# Patient Record
Sex: Male | Born: 1960 | Race: Black or African American | Hispanic: No | Marital: Married | Smoking: Never smoker
Health system: Southern US, Community
[De-identification: ages and names within clinical notes are randomized; demographics above are authoritative.]

---

## 2007-05-14 ENCOUNTER — Emergency Department (HOSPITAL_COMMUNITY): Admission: EM | Admit: 2007-05-14 | Discharge: 2007-05-14 | Payer: Self-pay | Admitting: Family Medicine

## 2008-10-29 ENCOUNTER — Emergency Department (HOSPITAL_COMMUNITY): Admission: EM | Admit: 2008-10-29 | Discharge: 2008-10-29 | Payer: Self-pay | Admitting: Family Medicine

## 2011-05-02 LAB — POCT URINALYSIS DIP (DEVICE)
Bilirubin Urine: NEGATIVE
Glucose, UA: NEGATIVE
Nitrite: NEGATIVE
Protein, ur: NEGATIVE
Specific Gravity, Urine: 1.02
Urobilinogen, UA: 0.2

## 2012-12-04 ENCOUNTER — Encounter (HOSPITAL_COMMUNITY): Payer: Self-pay | Admitting: Emergency Medicine

## 2012-12-04 ENCOUNTER — Emergency Department (HOSPITAL_COMMUNITY)
Admission: EM | Admit: 2012-12-04 | Discharge: 2012-12-04 | Disposition: A | Discharge status: Home / Self Care | Payer: BC Managed Care – PPO | Source: Home / Self Care

## 2012-12-04 DIAGNOSIS — M6580 Other synovitis and tenosynovitis, unspecified site: Secondary | ICD-10-CM

## 2012-12-04 DIAGNOSIS — M76899 Other specified enthesopathies of unspecified lower limb, excluding foot: Secondary | ICD-10-CM

## 2012-12-04 MED ORDER — TRIAMCINOLONE ACETONIDE 40 MG/ML IJ SUSP
INTRAMUSCULAR | Status: AC
  Filled 2012-12-04: qty 5

## 2012-12-04 NOTE — ED Notes (Signed)
Left knee pain, medially is painful area.  Onset approx 2 months ago and has worsened since then.  No known injury.  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  Describes pain as a stinging, burning sensation

## 2012-12-04 NOTE — ED Provider Notes (Signed)
History     CSN: [id number]  Arrival date & time 12/04/12  1235   First MD Initiated Contact with Patient 12/04/12 1417      Chief Complaint  Patient presents with  . Knee Pain    (Consider location/radiation/quality/duration/timing/severity/associated sxs/prior treatment) HPI Comments: 52 year old male presents with pain to the medial aspect of the left knee for over 2 months. 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 The pain is located over the medial aspect of the pain with the center over the medial tibial condyle. It is stiff in the morning but feels a little better after walking. At nighttime the pain is constant and throbbing. Worse with movement flexion and extension after resting for period of time. No known trauma.   History reviewed. No pertinent past medical history.  History reviewed. No pertinent past surgical history.  No family history on file.  History  Substance Use Topics  . Smoking status: Never Smoker   . Smokeless tobacco: Not on file  . Alcohol Use: No      Review of Systems  Constitutional: Negative.   Respiratory: Negative.   Gastrointestinal: Negative.   Genitourinary: Negative.   Musculoskeletal:       As per HPI  Skin: Negative.   Neurological: Negative for dizziness, weakness, numbness and headaches.    Allergies  Review of patient's allergies indicates no known allergies.  Home Medications  No current outpatient prescriptions on file.  BP 123/84  Pulse 95  Temp(Src) 98.9 F (37.2 C) (Oral)  Resp 19  SpO2 95%  Physical Exam  Nursing note and vitals reviewed. Constitutional: He is oriented to person, place, and time. He appears well-developed and well-nourished.  HENT:  Head: Normocephalic and atraumatic.  Eyes: EOM are normal. Left eye exhibits no discharge.  Neck: Normal range of motion. Neck supple.  Musculoskeletal:  No deformity, swelling, asymmetry or other  observable abnormalities of the left knee. There is point tenderness over the medial tibial condyle. The pain radiates outwardly in all directions but there is no tenderness towards the area of radiation. Flexion and extension is intact. External rotation produces pain.  Neurological: He is alert and oriented to person, place, and time. No cranial nerve deficit.  Skin: Skin is warm and dry.  Psychiatric: He has a normal mood and affect.    ED Course  Injection of joint Date/Time: 12/04/2012 3:00 PM Performed by: Phineas Real, Filip Luten Authorized by: Leslee Home C Consent: Verbal consent obtained. Risks and benefits: risks, benefits and alternatives were discussed Consent given by: patient Patient understanding: patient states understanding of the procedure being performed Patient identity confirmed: verbally with patient Local anesthesia used: yes Anesthesia: local infiltration Local anesthetic: topical anesthetic and bupivacaine 0.5% without epinephrine Patient sedated: no Patient tolerance: Patient tolerated the procedure well with no immediate complications. Comments: The point tenderness over the tendons attached to the left medial tibial condyle was injected with Kenalog and bupivacaine in such a way to bathe the area in the medication without injecting into the joint.   (including critical care time)  Labs Reviewed - No data to display No results found.   1. Knee tendinitis       MDM  Injected with Kenalog and Marcaine at the point of tenderness Apply ice to the medial aspect of the knees several times during the day May take ibuprofen as needed for pain Limited as much as possible the stooping and crawling on the  knees that precipitates pain. May need to followup with your primary care doctor oral orthopedic specialist if this continues.  Hayden Rasmussen, NP 12/04/12 1526  Hayden Rasmussen, NP 12/04/12 [phone]

## 2012-12-04 NOTE — ED Provider Notes (Signed)
Medical screening examination/treatment/procedure(s) were performed by non-physician practitioner and as supervising physician I was immediately available for consultation/collaboration.  Leslee Home, M.D.  Reuben Likes, MD 12/04/12 2209

## 2014-02-09 ENCOUNTER — Encounter (HOSPITAL_COMMUNITY): Payer: Self-pay | Admitting: Emergency Medicine

## 2014-02-09 ENCOUNTER — Emergency Department (HOSPITAL_COMMUNITY): Payer: No Typology Code available for payment source

## 2014-02-09 ENCOUNTER — Emergency Department (HOSPITAL_COMMUNITY)
Admission: EM | Admit: 2014-02-09 | Discharge: 2014-02-09 | Disposition: A | Discharge status: Home / Self Care | Payer: No Typology Code available for payment source | Attending: Emergency Medicine | Admitting: Emergency Medicine

## 2014-02-09 DIAGNOSIS — S99929A Unspecified injury of unspecified foot, initial encounter: Principal | ICD-10-CM

## 2014-02-09 DIAGNOSIS — S8990XA Unspecified injury of unspecified lower leg, initial encounter: Secondary | ICD-10-CM | POA: Insufficient documentation

## 2014-02-09 DIAGNOSIS — S79919A Unspecified injury of unspecified hip, initial encounter: Secondary | ICD-10-CM | POA: Insufficient documentation

## 2014-02-09 DIAGNOSIS — S0990XA Unspecified injury of head, initial encounter: Secondary | ICD-10-CM | POA: Insufficient documentation

## 2014-02-09 DIAGNOSIS — H53149 Visual discomfort, unspecified: Secondary | ICD-10-CM | POA: Insufficient documentation

## 2014-02-09 DIAGNOSIS — Y9389 Activity, other specified: Secondary | ICD-10-CM | POA: Insufficient documentation

## 2014-02-09 DIAGNOSIS — S79929A Unspecified injury of unspecified thigh, initial encounter: Secondary | ICD-10-CM

## 2014-02-09 DIAGNOSIS — S79922A Unspecified injury of left thigh, initial encounter: Secondary | ICD-10-CM

## 2014-02-09 DIAGNOSIS — S99919A Unspecified injury of unspecified ankle, initial encounter: Principal | ICD-10-CM

## 2014-02-09 DIAGNOSIS — S8992XA Unspecified injury of left lower leg, initial encounter: Secondary | ICD-10-CM

## 2014-02-09 DIAGNOSIS — Y92410 Unspecified street and highway as the place of occurrence of the external cause: Secondary | ICD-10-CM | POA: Insufficient documentation

## 2014-02-09 MED ORDER — ACETAMINOPHEN 500 MG PO TABS
1000.0000 mg | ORAL_TABLET | Freq: Once | ORAL | Status: AC
  Administered 2014-02-09: 1000 mg via ORAL
  Filled 2014-02-09: qty 2

## 2014-02-09 NOTE — ED Notes (Signed)
Pt cleared from spinal board, denies any pain to back or neck with palpation
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
## 2016-06-19 ENCOUNTER — Encounter (HOSPITAL_COMMUNITY): Payer: Self-pay | Admitting: Emergency Medicine

## 2016-06-19 ENCOUNTER — Ambulatory Visit (INDEPENDENT_AMBULATORY_CARE_PROVIDER_SITE_OTHER): Payer: Self-pay

## 2016-06-19 ENCOUNTER — Ambulatory Visit (HOSPITAL_COMMUNITY)
Admission: EM | Admit: 2016-06-19 | Discharge: 2016-06-19 | Disposition: A | Discharge status: Home / Self Care | Payer: Self-pay | Attending: Emergency Medicine | Admitting: Emergency Medicine

## 2016-06-19 DIAGNOSIS — M25561 Pain in right knee: Secondary | ICD-10-CM

## 2016-06-19 MED ORDER — TRAMADOL HCL 50 MG PO TABS: 50.0000 mg | ORAL_TABLET | Freq: Four times a day (QID) | ORAL | 0 refills | Status: DC | PRN

## 2016-06-19 MED ORDER — METHYLPREDNISOLONE ACETATE 40 MG/ML IJ SUSP
INTRAMUSCULAR | Status: AC
  Filled 2016-06-19: qty 1

## 2016-06-19 MED ORDER — LIDOCAINE HCL 2 % IJ SOLN
INTRAMUSCULAR | Status: AC
  Filled 2016-06-19: qty 20

## 2016-06-19 NOTE — ED Provider Notes (Signed)
[facility]    CSN: [id number] Arrival date & time: 06/19/16  1428     History   Chief Complaint Chief Complaint  Patient presents with  . Knee Pain    HPI Edythe LynnDale V Schroyer is a 55 y.o. male.   HPI  He is a 55 year old man here for evaluation of right knee pain. He reports pain in the right knee over the last 2 weeks. It has been a gradual onset. Pain is worse with flexion and extension. He states he is walking stifflegged due to the pain. 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 He denies any swelling. No weakness. He has been taking Tylenol and ibuprofen with intermittent improvement.  History reviewed. No pertinent past medical history.  There are no active problems to display for this patient.   History reviewed. No pertinent surgical history.     Home Medications    Prior to Admission medications   Medication Sig Start Date End Date Taking? Authorizing Provider  acetaminophen (TYLENOL) 500 MG tablet Take 500 mg by mouth every 6 (six) hours as needed for moderate pain.    Historical Provider, MD  traMADol (ULTRAM) 50 MG tablet Take 1 tablet (50 mg total) by mouth every 6 (six) hours as needed. 06/19/16   Charm RingsErin J Honig, MD    Family History History reviewed. No pertinent family history.  Social History Social History  Substance Use Topics  . Smoking status: Never Smoker  . Smokeless tobacco: Never Used  . Alcohol use Yes     Allergies   Patient has no known allergies.   Review of Systems Review of Systems As in history of present illness  Physical Exam Triage Vital Signs ED Triage Vitals  Enc Vitals Group     BP 06/19/16 1447 136/89     Pulse Rate 06/19/16 1447 100     Resp 06/19/16 1447 18     Temp 06/19/16 1447 98.2 F (36.8 C)     Temp Source 06/19/16 1447 Oral     SpO2 06/19/16 1447 97 %     Weight --      Height --      Head Circumference --      Peak Flow --        Pain Score 06/19/16 1450 8     Pain Loc --      Pain Edu? --      Excl. in GC? --    No data found.   Updated Vital Signs BP 136/89 (BP Location: Left Arm)   Pulse 100   Temp 98.2 F (36.8 C) (Oral)   Resp 18   SpO2 97%   Visual Acuity Right Eye Distance:   Left Eye Distance:   Bilateral Distance:    Right Eye Near:   Left Eye Near:    Bilateral Near:     Physical Exam  Constitutional: He is oriented to person, place, and time. He appears well-developed and well-nourished. No distress.  Cardiovascular: Normal rate.   Pulmonary/Chest: Effort normal.  Musculoskeletal:  Right knee: No erythema or edema. He has some chronic thickening of the skin on the anterior knee. No joint effusion. He is tender along the bilateral joint lines. 5 out of 5 strength in extension and flexion. Negative McMurray's.  Neurological: He is alert and oriented to person, place, and time.     UC Treatments / Results  Labs (all labs ordered  are listed, but only abnormal results are displayed) Labs Reviewed - No data to display  EKG  EKG Interpretation None       Radiology Dg Knee Complete 4 Views Right  Result Date: 06/19/2016 CLINICAL DATA:  55 year old male with right knee pain for the past week. No known injury. Initial encounter. EXAM: RIGHT KNEE - COMPLETE 4+ VIEW COMPARISON:  None. FINDINGS: Minimal patellofemoral joint degenerative changes. No joint effusion detected. No fracture or dislocation. IMPRESSION: Minimal patellofemoral joint degenerative changes. Electronically Signed   By: Lacy DuverneySteven  Olson M.D.   On: 06/19/2016 15:42    Procedures Injection of joint Date/Time: 06/19/2016 4:04 PM Performed by: Charm RingsHONIG, ERIN J Authorized by: Charm RingsHONIG, ERIN J  Consent: Verbal consent obtained. Risks and benefits: risks, benefits and alternatives were discussed Consent given by: patient Imaging studies: imaging studies available Patient identity confirmed: verbally with patient Time out:  Immediately prior to procedure a "time out" was called to verify the correct patient, procedure, equipment, support staff and site/side marked as required. Comments: Skin was prepped with alcohol. Cold spray was used for local anesthetic. 40 mg Depo-Medrol with 4 mL of 2% lidocaine was injected into the right knee. Patient tolerated procedure well with no immediate complication.    (including critical care time)  Medications Ordered in UC Medications - No data to display   Initial Impression / Assessment and Plan / UC Course  I have reviewed the triage vital signs and the nursing notes.  Pertinent labs & imaging results that were available during my care of the patient were reviewed by me and considered in my medical decision making (see chart for details).  Clinical Course     Cortisone injection done today. Recommended ice tonight. Tylenol and ibuprofen as needed. Prescription for tramadol to use as needed for severe pain.  Final Clinical Impressions(s) / UC Diagnoses   Final diagnoses:  Acute pain of right knee    New Prescriptions Discharge Medication List as of 06/19/2016  3:51 PM    START taking these medications   Details  traMADol (ULTRAM) 50 MG tablet Take 1 tablet (50 mg total) by mouth every 6 (six) hours as needed., Starting Tue 06/19/2016, Normal         Charm RingsErin J Honig, MD 06/19/16 1605

## 2016-06-19 NOTE — Discharge Instructions (Signed)
We did a cortisone injection in your knee. Apply ice tonight. You should see improvement over the next 24-48 hours. Continue Tylenol or ibuprofen as needed. Use the tramadol every 6-8 hours as needed for pain. Do not drive while taking this medicine. Follow-up as needed.

## 2017-04-23 ENCOUNTER — Ambulatory Visit (HOSPITAL_COMMUNITY)
Admission: EM | Admit: 2017-04-23 | Discharge: 2017-04-23 | Disposition: A | Discharge status: Home / Self Care | Payer: Self-pay | Attending: Family Medicine | Admitting: Family Medicine

## 2017-04-23 ENCOUNTER — Encounter (HOSPITAL_COMMUNITY): Payer: Self-pay | Admitting: Emergency Medicine

## 2017-04-23 DIAGNOSIS — M25561 Pain in right knee: Secondary | ICD-10-CM

## 2017-04-23 MED ORDER — METHYLPREDNISOLONE SODIUM SUCC 125 MG IJ SOLR
INTRAMUSCULAR | Status: AC
  Filled 2017-04-23: qty 2

## 2017-04-23 MED ORDER — METHYLPREDNISOLONE ACETATE 80 MG/ML IJ SUSP
INTRAMUSCULAR | Status: AC
  Filled 2017-04-23: qty 1

## 2017-04-23 MED ORDER — METHYLPREDNISOLONE SODIUM SUCC 125 MG IJ SOLR
80.0000 mg | Freq: Once | INTRAMUSCULAR | Status: AC
  Administered 2017-04-23: 80 mg via INTRAMUSCULAR

## 2017-04-23 MED ORDER — DICLOFENAC SODIUM 75 MG PO TBEC: 75.0000 mg | DELAYED_RELEASE_TABLET | Freq: Two times a day (BID) | ORAL | 0 refills | Status: DC

## 2017-04-23 NOTE — ED Triage Notes (Signed)
Pt here for right knee pain x 2 days after stepping off ladder 3 days ago

## 2017-04-24 NOTE — ED Provider Notes (Signed)
  [facility]   [id number] 04/23/17 Arrival Time: 1610  ASSESSMENT & PLAN:  1. Acute pain of right knee     Meds ordered this encounter  Medications  . diclofenac (VOLTAREN) 75 MG EC tablet    Sig: Take 1 tablet (75 mg total) by mouth 2 (two) times daily.    Dispense:  14 tablet    Refill:  0  . methylPREDNISolone sodium succinate (SOLU-MEDROL) 125 mg/2 mL injection 80 mg   Recommend f/u if not showing improvement over the next 1 week. Reviewed expectations re: course of current medical issues. Questions answered. Outlined signs and symptoms indicating need for more acute intervention. Patient verbalized understanding. After Visit Summary given.   SUBJECTIVE:  Seth Burns is a 56 y.o. male who presents with complaint of R knee pain. Gradual onset over the past week. No specific injury. Works on his knees a lot. Climbs ladders frequently. Ambulatory but exacerbates knee discomfort. No h/o similar. Occasionally wakes at night. PRN ibuprofen without much help. No extremity sensation changes or weakness. No direct trauma reported. No locking up or giving out reported.  ROS: As per HPI.   OBJECTIVE:  Vitals:   04/23/17 1637  BP: 130/73  Pulse: 82  Resp: 18  Temp: 98.2 F (36.8 C)  TempSrc: Oral  SpO2: 96%  Weight: 275 lb (124.7 kg)  Height:  (1.88 m)    General appearance: alert; no distress Extremities: R knee with FROM; tender over medial and lateral aspects; no instability; no swelling or effusio Skin: warm and dry Neurologic: normal gait; normal symmetric reflexes Psychological: alert and cooperative; normal mood and affect  No Known Allergies   Social History   Social History  . Marital status: Married    Spouse name: N/A  . Number of children: N/A  . Years of education: N/A   Occupational History  . Not on file.   Social History Main Topics  . Smoking status: Never Smoker  . Smokeless tobacco: Never Used  . Alcohol use Yes  . Drug  use: No  . Sexual activity: Not on file   Other Topics Concern  . Not on file   Social History Narrative  . No narrative on file      Mardella Layman, MD 04/24/17 [phone]

## 2018-02-27 IMAGING — DX DG KNEE COMPLETE 4+V*R*
4 series · 4 of 4 positions shown · non-contrast
Comparison: None.

CLINICAL DATA: 55-year-old male with right knee pain for the past
week. No known injury. Initial encounter.

EXAM:
RIGHT KNEE - COMPLETE 4+ VIEW

[knee ap]
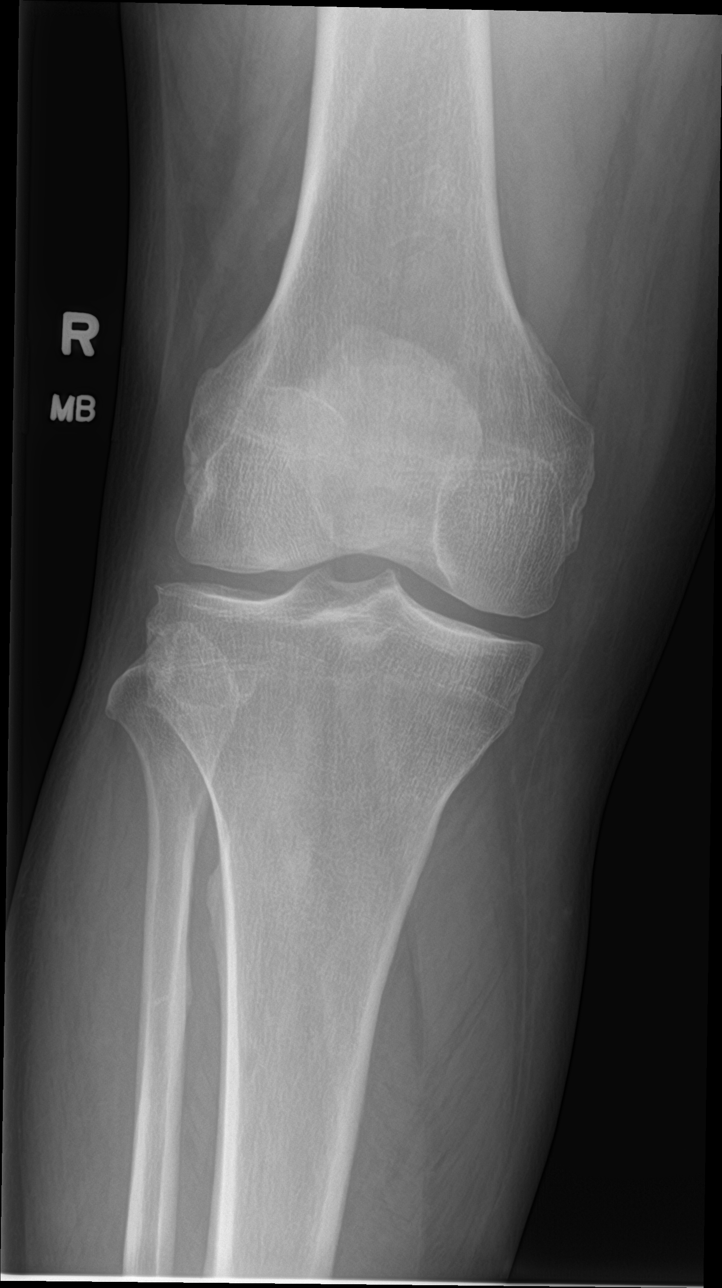

[knee obl (1 of 2)]
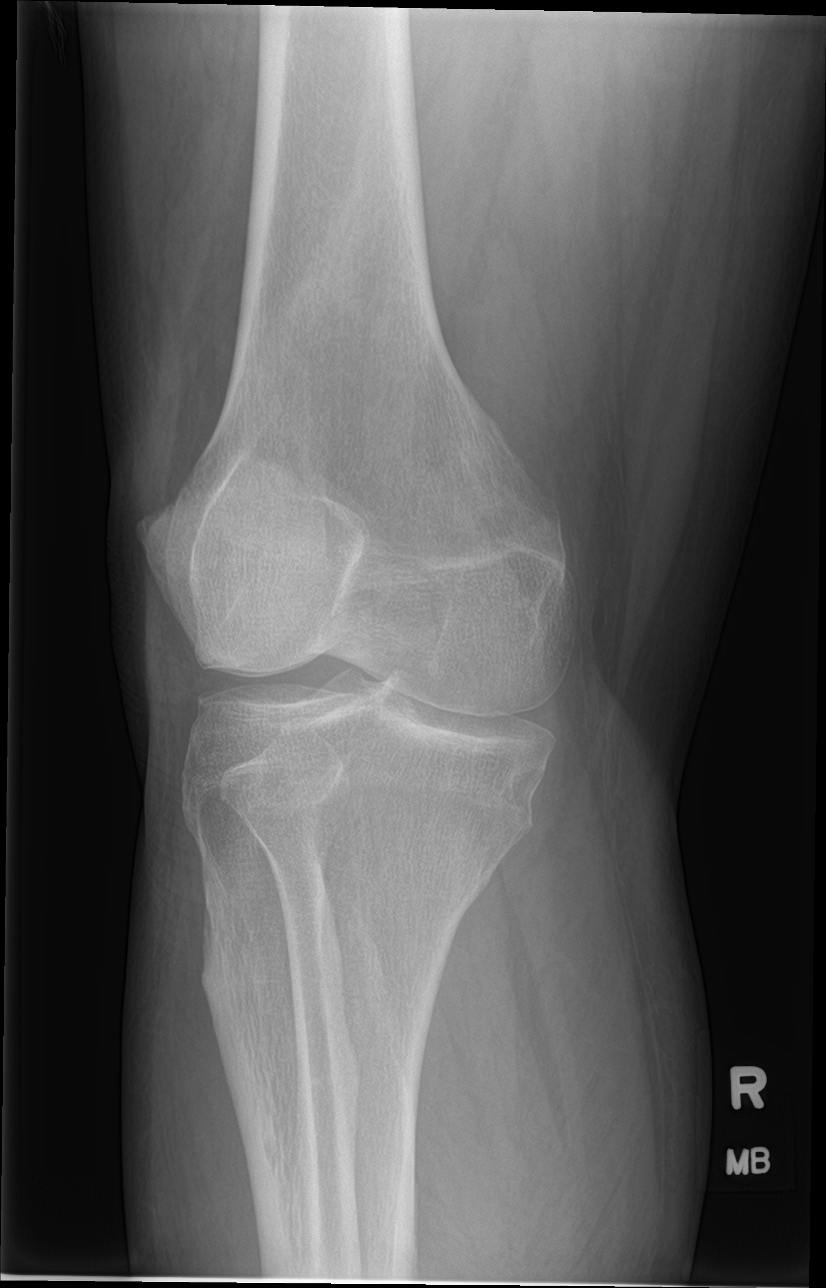

[knee obl (2 of 2)]
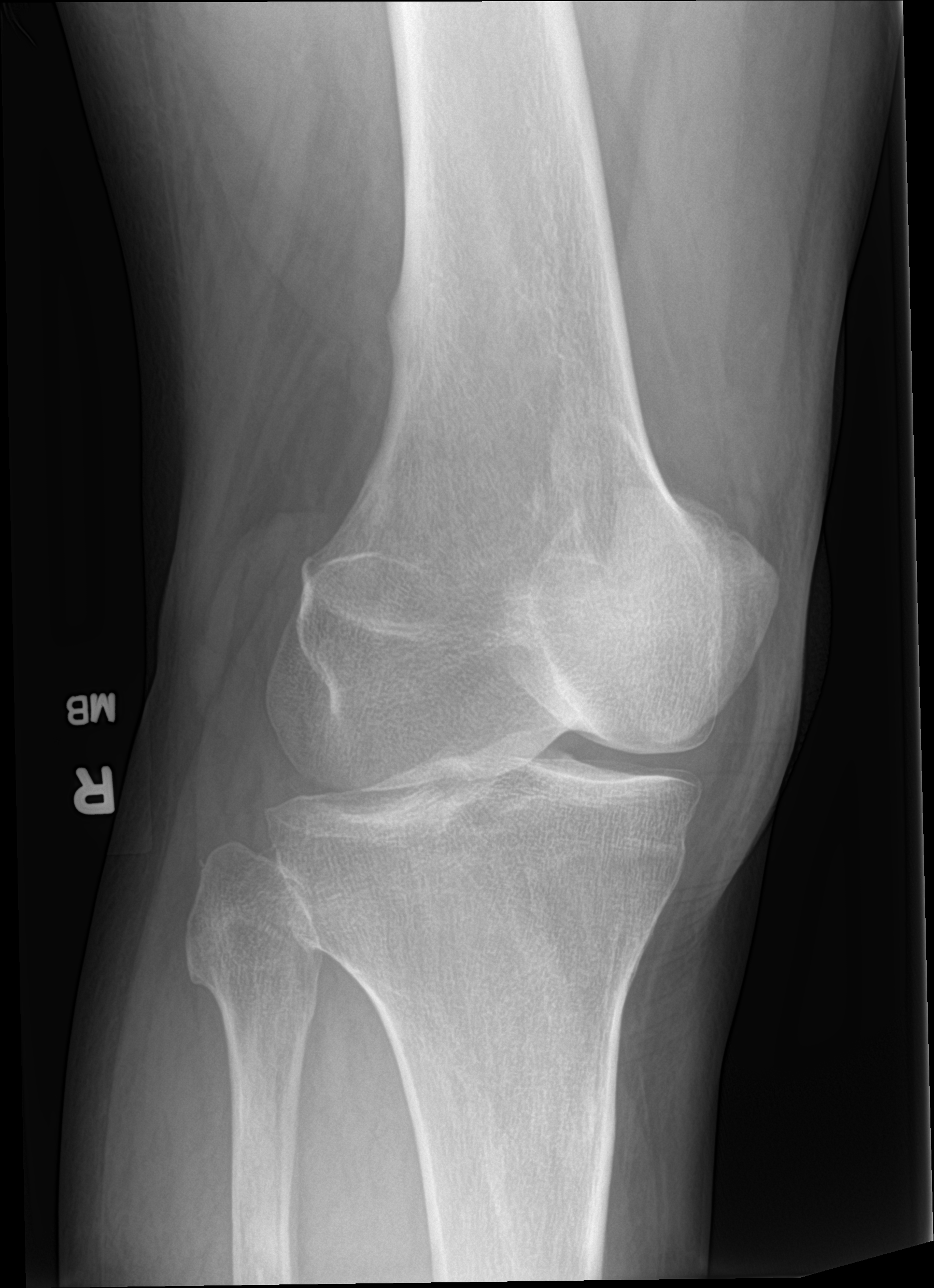

[knee lat]
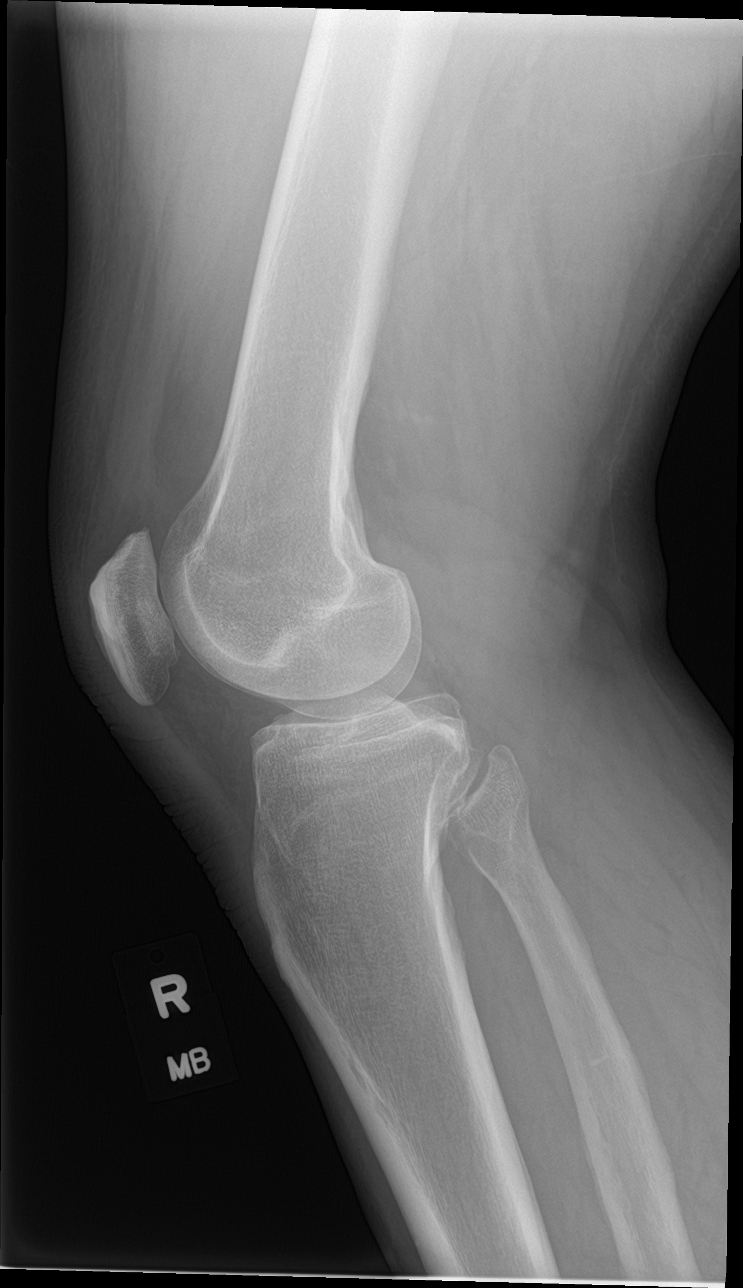

[4 of 4 positions shown; findings below may reference images not displayed]

FINDINGS: Minimal patellofemoral joint degenerative changes.

No joint effusion detected.

No fracture or dislocation.
IMPRESSION: Minimal patellofemoral joint degenerative changes.

## 2019-07-20 ENCOUNTER — Ambulatory Visit (HOSPITAL_COMMUNITY)
Admission: EM | Admit: 2019-07-20 | Discharge: 2019-07-20 | Disposition: A | Discharge status: Home / Self Care | Payer: Self-pay

## 2019-07-21 ENCOUNTER — Ambulatory Visit (HOSPITAL_COMMUNITY)
Admission: EM | Admit: 2019-07-21 | Discharge: 2019-07-21 | Disposition: A | Discharge status: Home / Self Care | Payer: Self-pay | Attending: Family Medicine | Admitting: Family Medicine

## 2019-07-21 ENCOUNTER — Encounter (HOSPITAL_COMMUNITY): Payer: Self-pay

## 2019-07-21 ENCOUNTER — Other Ambulatory Visit: Payer: Self-pay

## 2019-07-21 DIAGNOSIS — U071 COVID-19: Secondary | ICD-10-CM

## 2019-07-21 MED ORDER — IBUPROFEN 800 MG PO TABS: 800.0000 mg | ORAL_TABLET | Freq: Three times a day (TID) | ORAL | 0 refills | Status: DC

## 2019-07-21 NOTE — ED Notes (Signed)
Covid test: positive Reported results to provider

## 2019-07-21 NOTE — ED Triage Notes (Signed)
Pt c/o chills, cough,headache, loss of taste since 12/25; denies fever, n/v/d or body aches. Provider notified of fever.

## 2019-07-21 NOTE — ED Provider Notes (Addendum)
[facility]   [id number] 07/21/19 Arrival Time: [place] PLAN:  1. COVID-19 virus infection     See work note for self-isolation instructions. AVS for general COVID instructions.  Meds ordered this encounter  Medications  . ibuprofen (ADVIL) 800 MG tablet    Sig: Take 1 tablet (800 mg total) by mouth 3 (three) times daily with meals.    Dispense:  21 tablet    Refill:  0    Follow-up Information    [facility].   Specialty: Urgent Care Why: As needed. Contact information: [place] [phone]          Reviewed expectations re: course of current medical issues. Questions answered. Outlined signs and symptoms indicating need for more acute intervention. Patient verbalized understanding. After Visit Summary given.   SUBJECTIVE: History from: patient. Seth Burns is a 58 y.o. male who requests COVID-19 testing. Known COVID-19 contact: none. Recent travel: none. Reports subjective fever with chills, headache, cough, loss of taste for the past 3-4 days. Denies: runny nose, sore throat and difficulty breathing. Normal PO intake without n/v/d.  ROS: As per HPI.   OBJECTIVE:  Vitals:   07/21/19 1402  BP: 124/83  Pulse: 96  Resp: 20  Temp: (!) 101.5 F (38.6 C)  TempSrc: Oral  SpO2: 97%    General appearance: alert; no distress Eyes: PERRLA; EOMI; conjunctiva normal HENT: [place]; AT; mild nasal congestion; oral mucosa normal Neck: supple  Lungs: speaks full sentences without difficulty; unlabored Heart: regular rate and rhythm Abdomen: soft, non-tender Extremities: no edema Skin: warm and dry Neurologic: normal gait Psychological: alert and cooperative; normal mood and affect  Labs:  Labs Reviewed  POC SARS CORONAVIRUS 2 AG -  ED    No Known Allergies   Social History   Socioeconomic History  . Marital status: Married    Spouse name: Not on file    . Number of children: Not on file  . Years of education: Not on file  . Highest education level: Not on file  Occupational History  . Not on file  Tobacco Use  . Smoking status: Never Smoker  . Smokeless tobacco: Never Used  Substance and Sexual Activity  . Alcohol use: Yes  . Drug use: No  . Sexual activity: Not on file  Other Topics Concern  . Not on file  Social History Narrative  . Not on file   Social Determinants of Health   Financial Resource Strain:   . Difficulty of Paying Living Expenses: Not on file  Food Insecurity:   . Worried About Charity fundraiser in the Last Year: Not on file  . Ran Out of Food in the Last Year: Not on file  Transportation Needs:   . Lack of Transportation (Medical): Not on file  . Lack of Transportation (Non-Medical): Not on file  Physical Activity:   . Days of Exercise per Week: Not on file  . Minutes of Exercise per Session: Not on file  Stress:   . Feeling of Stress : Not on file  Social Connections:   . Frequency of Communication with Friends and Family: Not on file  . Frequency of Social Gatherings with Friends and Family: Not on file  . Attends Religious Services: Not on file  . Active Member of Clubs or Organizations: Not on file  . Attends Archivist Meetings: Not on file  . Marital Status:  Not on file  Intimate Partner Violence:   . Fear of Current or Ex-Partner: Not on file  . Emotionally Abused: Not on file  . Physically Abused: Not on file  . Sexually Abused: Not on file   Family History  Problem Relation Age of Onset  . Healthy Mother    Past Surgical History:  Procedure Laterality Date  . BREAST SURGERY       Mardella Layman, MD 07/21/19 1510    Mardella Layman, MD 07/21/19 [phone]

## 2019-07-26 DIAGNOSIS — R7401 Elevation of levels of liver transaminase levels: Secondary | ICD-10-CM | POA: Insufficient documentation

## 2019-07-27 DIAGNOSIS — R809 Proteinuria, unspecified: Secondary | ICD-10-CM | POA: Insufficient documentation

## 2019-07-27 DIAGNOSIS — E119 Type 2 diabetes mellitus without complications: Secondary | ICD-10-CM | POA: Insufficient documentation

## 2020-07-01 ENCOUNTER — Other Ambulatory Visit: Payer: Self-pay

## 2020-07-01 ENCOUNTER — Ambulatory Visit (HOSPITAL_COMMUNITY)
Admission: EM | Admit: 2020-07-01 | Discharge: 2020-07-01 | Disposition: A | Discharge status: Home / Self Care | Payer: Self-pay | Attending: Internal Medicine | Admitting: Internal Medicine

## 2020-07-01 ENCOUNTER — Encounter (HOSPITAL_COMMUNITY): Payer: Self-pay | Admitting: Emergency Medicine

## 2020-07-01 DIAGNOSIS — S93401A Sprain of unspecified ligament of right ankle, initial encounter: Secondary | ICD-10-CM | POA: Insufficient documentation

## 2020-07-01 LAB — BASIC METABOLIC PANEL
Anion gap: 9 (ref 5–15)
BUN: 11 mg/dL (ref 8–23)
CO2: 25 mmol/L (ref 22–32)
Calcium: 8.9 mg/dL (ref 8.9–10.3)
Chloride: 106 mmol/L (ref 98–111)
Creatinine, Ser: 0.9 mg/dL (ref 0.61–1.24)
GFR, Estimated: >60 mL/min (ref >60)
Glucose, Bld: 96 mg/dL (ref 70–99)
Potassium: 4.1 mmol/L (ref 3.5–5.1)
Sodium: 140 mmol/L (ref 135–145)

## 2020-07-01 LAB — URIC ACID: Uric Acid, Serum: 6.6 mg/dL (ref 3.7–8.6)

## 2020-07-01 LAB — HEMOGLOBIN A1C
Hgb A1c MFr Bld: 6.4 % — ABNORMAL HIGH (ref 4.8–5.6)
Mean Plasma Glucose: 136.98 mg/dL

## 2020-07-01 LAB — CBG MONITORING, ED: Glucose-Capillary: 97 mg/dL (ref 70–99)

## 2020-07-01 MED ORDER — KETOROLAC TROMETHAMINE 60 MG/2ML IM SOLN
60.0000 mg | Freq: Once | INTRAMUSCULAR | Status: AC
  Administered 2020-07-01: 60 mg via INTRAMUSCULAR

## 2020-07-01 MED ORDER — KETOROLAC TROMETHAMINE 60 MG/2ML IM SOLN
INTRAMUSCULAR | Status: AC
  Filled 2020-07-01: qty 2

## 2020-07-01 MED ORDER — IBUPROFEN 800 MG PO TABS: 800.0000 mg | ORAL_TABLET | Freq: Three times a day (TID) | ORAL | 0 refills | Status: AC

## 2020-07-01 NOTE — ED Triage Notes (Signed)
Patient c/o RT ankle pain x 2 days ago.   Patient states "pain came all of a sudden without any injury".   Patient has taken Tylenol w/ no relief of pain.   Patient denies fall or trauma.   Patient denies previous ankle history.

## 2020-07-01 NOTE — ED Provider Notes (Signed)
[facility]    CSN: [id number] Arrival date & time: 07/01/20  1459      History   Chief Complaint Chief Complaint  Patient presents with  . Ankle Pain    HPI Seth Burns is a 59 y.o. male comes to the urgent care with a 2-day history of right ankle pain that started all of a sudden.  Pain is sharp, severe, aggravated by movement and palpation.  No known relieving factors.  Is associated with swelling.  No erythema.  Patient has not tried any over-the-counter medications.  He denies any trauma.  He recently returned from [place] on vacation and did quite a bit of walking while she was there.  No sore throat, runny nose, sneezing cough.  No fever or chills.  No shortness of breath.  Patient is fully vaccinated against COVID-19 virus and contracted COVID-19 infection earlier this year..   Patient is diabetic on Metformin.  He does not check his blood sugars regularly.  He also complains about elevated blood pressure.  He is not on medication.  HPI  History reviewed. No pertinent past medical history.  There are no problems to display for this patient.   Past Surgical History:  Procedure Laterality Date  . BREAST SURGERY         Home Medications    Prior to Admission medications   Medication Sig Start Date End Date Taking? Authorizing Provider  ibuprofen (ADVIL) 800 MG tablet Take 1 tablet (800 mg total) by mouth 3 (three) times daily with meals. 07/01/20   LampteyBritta Mccreedy, MD    Family History Family History  Problem Relation Age of Onset  . Healthy Mother     Social History Social History   Tobacco Use  . Smoking status: Never Smoker  . Smokeless tobacco: Never Used  Substance Use Topics  . Alcohol use: Yes  . Drug use: No     Allergies   Patient has no known allergies.   Review of Systems Review of Systems  Genitourinary: Negative.   Musculoskeletal: Positive for arthralgias and joint swelling. Negative for gait problem and myalgias.   Skin: Negative.   Neurological: Negative.      Physical Exam Triage Vital Signs ED Triage Vitals  Enc Vitals Group     BP 07/01/20 1557 (!) 154/101     Pulse Rate 07/01/20 1557 96     Resp 07/01/20 1557 17     Temp 07/01/20 1557 99.8 F (37.7 C)     Temp Source 07/01/20 1557 Oral     SpO2 07/01/20 1557 98 %     Weight 07/01/20 1555 280 lb (127 kg)     Height 07/01/20 1555 6\' 2"  (1.88 m)     Head Circumference --      Peak Flow --      Pain Score 07/01/20 1555 8     Pain Loc --      Pain Edu? --      Excl. in GC? --    No data found.  Updated Vital Signs BP (!) 154/101 (BP Location: Right Arm)   Pulse 96   Temp 99.8 F (37.7 C) (Oral)   Resp 17   Ht 6\' 2"  (1.88 m)   Wt 127 kg   SpO2 98%   BMI 35.95 kg/m   Visual Acuity Right Eye Distance:   Left Eye Distance:   Bilateral Distance:    Right Eye Near:   Left Eye Near:  Bilateral Near:     Physical Exam Vitals and nursing note reviewed.  Cardiovascular:     Pulses: Normal pulses.     Heart sounds: Normal heart sounds.  Musculoskeletal:        General: Swelling and tenderness present. No deformity or signs of injury.     Comments: Limited range of motion around the right ankle secondary to pain.  Skin:    Capillary Refill: Capillary refill takes less than 2 seconds.  Neurological:     General: No focal deficit present.      UC Treatments / Results  Labs (all labs ordered are listed, but only abnormal results are displayed) Labs Reviewed  HEMOGLOBIN A1C  URIC ACID  BASIC METABOLIC PANEL  CBG MONITORING, ED    EKG   Radiology No results found.  Procedures Procedures (including critical care time)  Medications Ordered in UC Medications  ketorolac (TORADOL) injection 60 mg (60 mg Intramuscular Given 07/01/20 1754)    Initial Impression / Assessment and Plan / UC Course  I have reviewed the triage vital signs and the nursing notes.  Pertinent labs & imaging results that were  available during my care of the patient were reviewed by me and considered in my medical decision making (see chart for details).     1.  Right ankle sprain: Gentle range of motion exercises Patient has ankle boot as a disposal at home Ibuprofen 800 mg every 8 hours as needed for pain CBG is 97 Toradol 60 mg IM x1 dose Hemoglobin A1c, uric acid Return precautions given Final Clinical Impressions(s) / UC Diagnoses   Final diagnoses:  Sprain of right ankle, unspecified ligament, initial encounter   Discharge Instructions   None    ED Prescriptions    Medication Sig Dispense Auth. Provider   ibuprofen (ADVIL) 800 MG tablet Take 1 tablet (800 mg total) by mouth 3 (three) times daily with meals. 21 tablet [place] Daily, Britta Mccreedy, MD     PDMP not reviewed this encounter.   Merrilee Jansky, MD 07/01/20 1806

## 2021-05-09 DIAGNOSIS — N6342 Unspecified lump in left breast, subareolar: Secondary | ICD-10-CM | POA: Insufficient documentation

## 2021-05-09 DIAGNOSIS — K429 Umbilical hernia without obstruction or gangrene: Secondary | ICD-10-CM | POA: Insufficient documentation

## 2021-11-19 DIAGNOSIS — H8110 Benign paroxysmal vertigo, unspecified ear: Secondary | ICD-10-CM | POA: Diagnosis not present

## 2021-11-25 DIAGNOSIS — H8110 Benign paroxysmal vertigo, unspecified ear: Secondary | ICD-10-CM | POA: Diagnosis not present

## 2022-02-27 DIAGNOSIS — K429 Umbilical hernia without obstruction or gangrene: Secondary | ICD-10-CM | POA: Diagnosis not present

## 2022-02-27 DIAGNOSIS — N6342 Unspecified lump in left breast, subareolar: Secondary | ICD-10-CM | POA: Diagnosis not present

## 2022-09-25 DIAGNOSIS — D239 Other benign neoplasm of skin, unspecified: Secondary | ICD-10-CM | POA: Insufficient documentation

## 2022-09-25 DIAGNOSIS — N6342 Unspecified lump in left breast, subareolar: Secondary | ICD-10-CM | POA: Diagnosis not present

## 2022-10-15 DIAGNOSIS — E7800 Pure hypercholesterolemia, unspecified: Secondary | ICD-10-CM | POA: Diagnosis not present

## 2022-10-15 DIAGNOSIS — E1169 Type 2 diabetes mellitus with other specified complication: Secondary | ICD-10-CM | POA: Diagnosis not present

## 2022-10-15 DIAGNOSIS — Z125 Encounter for screening for malignant neoplasm of prostate: Secondary | ICD-10-CM | POA: Diagnosis not present

## 2023-05-06 DIAGNOSIS — E1140 Type 2 diabetes mellitus with diabetic neuropathy, unspecified: Secondary | ICD-10-CM | POA: Diagnosis not present

## 2023-05-06 DIAGNOSIS — N6320 Unspecified lump in the left breast, unspecified quadrant: Secondary | ICD-10-CM | POA: Diagnosis not present

## 2023-05-06 DIAGNOSIS — E7800 Pure hypercholesterolemia, unspecified: Secondary | ICD-10-CM | POA: Diagnosis not present

## 2023-05-06 DIAGNOSIS — K625 Hemorrhage of anus and rectum: Secondary | ICD-10-CM | POA: Diagnosis not present

## 2024-05-28 NOTE — Progress Notes (Signed)
 [facility]                                          MRN: [id number]   05/28/2024   The [facility] Quality Team Specialist reviewed this patient medical record for the purposes of chart review for care gap closure. The following were reviewed: chart review for care gap closure-glycemic status assessment.    [facility] Quality Team

## 2024-06-01 ENCOUNTER — Ambulatory Visit: Payer: Self-pay | Admitting: Podiatry

## 2024-06-01 ENCOUNTER — Encounter: Payer: Self-pay | Admitting: Podiatry

## 2024-06-01 DIAGNOSIS — B351 Tinea unguium: Secondary | ICD-10-CM | POA: Diagnosis not present

## 2024-06-01 DIAGNOSIS — L853 Xerosis cutis: Secondary | ICD-10-CM

## 2024-06-01 DIAGNOSIS — E1142 Type 2 diabetes mellitus with diabetic polyneuropathy: Secondary | ICD-10-CM | POA: Diagnosis not present

## 2024-06-01 MED ORDER — AMMONIUM LACTATE 12 % EX LOTN
1.0000 | TOPICAL_LOTION | Freq: Every day | CUTANEOUS | 5 refills | Status: AC
Start: 2024-06-01 — End: ?

## 2024-06-01 MED ORDER — GABAPENTIN 300 MG PO CAPS: 300.0000 mg | ORAL_CAPSULE | Freq: Every day | ORAL | 3 refills | Status: AC

## 2024-06-01 NOTE — Progress Notes (Signed)
 Chief Complaint  Patient presents with   Diabetes    DFC NIDDM A1C 6.4. Tingling and throbbing mostly in the crease of the toes. Chronic issue. Not taking metformin like instructed.   Discussed the use of AI scribe software for clinical note transcription with the patient, who gave verbal consent to proceed.  History of Present Illness Seth Burns is a 63 year old male with diabetes who presents with symptoms of neuropathy in his feet.  He has been experiencing tingling sensations in his left foot and the first three toes of his right foot for the past couple of months. The symptoms are more pronounced at night, interfering with his ability to fall asleep. He describes the sensation as internal and notes that external heat sources, such as a heat pad or hot tub, do not alleviate the discomfort.  During the day, when he is active, he does not experience the symptoms. He has not tried any specific medications for the neuropathy, although he mentions not using his metformin for this issue.  He does not recall his last A1c level and does not monitor his daily blood sugar levels. He mentions a history of diabetes but does not provide specific details about his management or control of the condition.  No similar symptoms in his hands and no itching on the bottom of his feet. He uses Vaseline at night to moisturize his feet.   History reviewed. No pertinent past medical history. Past Surgical History:  Procedure Laterality Date   BREAST SURGERY     No Known Allergies  Physical Exam Palpable pedal pulses bilateral.  Mild generalized edema in the lower legs and ankles bilateral.  Interspaces clear of maceration and debris.  Negative Tinel's sign with percussion of the posterior tibial nerve bilateral.  Vibration sense absent in forefoot bilateral and diminished at the medial malleolus bilateral.  Altered temperature sensation in the feet bilateral.  Semmes Weinstein monofilament sensation  intact except at tips of toes bilateral.  Manual muscle testing 5/5 bilateral.  Nails are 3 mm thick with yellow and brown discoloration, subungual debris, distal onycholysis consistent with onychomycosis.  They are mildly elongated.  Skin is dry and flaky bilateral plantar foot.  Denies pruritus.  There is a palpable mobile firm mass in the anteromedial aspect of the right midfoot.  There is no pain on palpation.  This is mobile up to 2 cm within the subcutaneous tissues.  It is nonpulsatile.  It measures less than 1 cm in diameter when palpating the edges.   Assessment/Plan of Care: 1. Type 2 diabetes mellitus with peripheral neuropathy (HCC)   2. Dermatophytosis of nail   3. Xerosis of skin      Meds ordered this encounter  Medications   gabapentin (NEURONTIN) 300 MG capsule    Sig: Take 1 capsule (300 mg total) by mouth at bedtime.    Dispense:  30 capsule    Refill:  3   ammonium lactate (LAC-HYDRIN) 12 % lotion    Sig: Apply 1 Application topically daily.    Dispense:  400 g    Refill:  5   Assessment & Plan Type 2 diabetes mellitus with diabetic polyneuropathy Chronic diabetic polyneuropathy affecting the left foot and the first three toes of the right foot, with symptoms of tingling, particularly at night, disrupting sleep. No improvement with over-the-counter treatments or heat therapy. No recent blood work available to assess current glycemic control. Discussed dietary modifications to manage blood sugar levels, emphasizing the  reduction of sugar and carbohydrates, and the importance of lemon water and a diet rich in meats, fish, and vegetables. Discussed the potential benefit of diabetic shoes for foot protection. - Prescribed a 30-day trial of medication for neuropathy to be taken in the evening before bed.  Gabapentin 300 mg nightly sent to his pharmacy.  - Provided forms for diabetic shoes referral to [facility] in [place].  I completed my forms and requested that he take these  directly to his primary care physician who manages his diabetes to fill out the additional page and then their office can fax this directly to [facility].  Then [facility] will reach out to the patient to get him scheduled for an appointment. - Advised dietary modifications to reduce sugar and carbohydrate intake.  He should speak to his primary care physician regarding a referral to a nutritionist to explain what type of diet he should be on and help guide him when it comes to grocery shopping and meal preparation.  Xerosis cutis of feet Dry skin on the feet managed with Vaseline at night. Discussed the potential benefit of prescription-strength dry skin cream for better penetration and management. - Prescribed prescription-strength dry skin cream.  (Ammonium lactate lotion)  Benign cyst of right foot -medial midfoot Benign cyst on the right foot, present for years, not causing pain or functional issues. No intervention required unless it becomes symptomatic. - Advised to leave the cyst alone unless it becomes symptomatic.        Awanda CHARM Imperial, DPM, FACFAS [facility]     [address] [place], [place] [address]                Office [phone]  Fax [phone]

## 2024-07-06 ENCOUNTER — Encounter: Admitting: Podiatry

## 2024-07-09 NOTE — Progress Notes (Signed)
 Patient did not show for his scheduled appointment on 07/06/2024
